# Patient Record
Sex: Male | Born: 1980 | Race: Asian | Hispanic: No | Marital: Single | Smoking: Never smoker
Health system: Southern US, Community
[De-identification: ages and names within clinical notes are randomized; demographics above are authoritative.]

## PROBLEM LIST (undated history)

## (undated) DIAGNOSIS — E7800 Pure hypercholesterolemia, unspecified: Secondary | ICD-10-CM

## (undated) DIAGNOSIS — E119 Type 2 diabetes mellitus without complications: Secondary | ICD-10-CM

## (undated) DIAGNOSIS — I10 Essential (primary) hypertension: Secondary | ICD-10-CM

---

## 2010-07-12 ENCOUNTER — Ambulatory Visit: Payer: Self-pay | Admitting: Family Medicine

## 2010-08-31 ENCOUNTER — Ambulatory Visit: Payer: Self-pay

## 2019-01-14 ENCOUNTER — Other Ambulatory Visit: Payer: Self-pay

## 2019-01-14 ENCOUNTER — Ambulatory Visit
Admission: EM | Admit: 2019-01-14 | Discharge: 2019-01-14 | Disposition: A | Discharge status: Home / Self Care | Payer: BC Managed Care – PPO | Attending: Family Medicine | Admitting: Family Medicine

## 2019-01-14 ENCOUNTER — Encounter: Payer: Self-pay | Admitting: Emergency Medicine

## 2019-01-14 DIAGNOSIS — M109 Gout, unspecified: Secondary | ICD-10-CM

## 2019-01-14 HISTORY — DX: Pure hypercholesterolemia, unspecified: E78.00

## 2019-01-14 HISTORY — DX: Essential (primary) hypertension: I10

## 2019-01-14 HISTORY — DX: Type 2 diabetes mellitus without complications: E11.9

## 2019-01-14 MED ORDER — INDOMETHACIN 50 MG PO CAPS: 50.0000 mg | ORAL_CAPSULE | Freq: Three times a day (TID) | ORAL | 0 refills | Status: AC

## 2019-01-14 NOTE — ED Triage Notes (Signed)
Patient c/o right foot pain that started on Saturday. He denies injury. States his foot is painful to walk on and he has swelling.

## 2019-01-14 NOTE — ED Provider Notes (Signed)
[facility]    CSN: [id number] Arrival date & time: 01/14/19  1832  History   Chief Complaint Chief Complaint  Patient presents with  . Foot Pain   HPI  38 year old male presents with foot pain.  Patient reports that his pain started on Saturday.  He localizes the pain to the MTP joint of the great toe of the right foot.  Pain is moderate to severe.  Worse with ambulation.  No known inciting factor.  He has iced the area without resolution.  No medications or other interventions tried.  No relieving factors.  No other complaints.  Hx reviewed as below. Past Medical History:  Diagnosis Date  . Diabetes mellitus without complication (HCC)   . Hypercholesterolemia   . Hypertension   Morbid obesity Depression Urolithiasis  Home Medications    Prior to Admission medications   Medication Sig Start Date End Date Taking? Authorizing Provider  atorvastatin (LIPITOR) 10 MG tablet Take 10 mg by mouth daily. 09/11/18  Yes [provider]  dapagliflozin propanediol (FARXIGA) 10 MG TABS tablet Take 1/2 tab daily for 1 week then increase to one tab thereafter. 09/11/18  Yes [provider]  glipiZIDE-metformin (METAGLIP) 2.5-500 MG tablet Take by mouth. 09/11/18 09/11/19 Yes [provider]  lisinopril-hydrochlorothiazide (ZESTORETIC) 20-25 MG tablet Take by mouth. 09/11/18 09/11/19 Yes [provider]  indomethacin (INDOCIN) 50 MG capsule Take 1 capsule (50 mg total) by mouth 3 (three) times daily with meals. Discontinue 2 to 3 days after resolution of pain 01/14/19   Tommie Sams, DO   Family Hx: Coronary Artery Disease (Blocked arteries around heart) Father Dad   Diabetes type II Father Dad   High blood pressure (Hypertension) Father Dad   Alcohol abuse Maternal Grandfather Tamera Stands   Hepatitis Mother      Social History Social History   Tobacco Use  . Smoking status: Never Smoker  . Smokeless tobacco: Never Used  Substance  Use Topics  . Alcohol use: Yes  . Drug use: Never     Allergies   Patient has no known allergies.   Review of Systems Review of Systems  Constitutional: Negative.   Musculoskeletal:       Right toe pain.   Physical Exam Triage Vital Signs ED Triage Vitals  Enc Vitals Group     BP 01/14/19 1850 (!) 151/113     Pulse Rate 01/14/19 1850 92     Resp 01/14/19 1850 18     Temp 01/14/19 1850 98.5 F (36.9 C)     Temp Source 01/14/19 1850 Oral     SpO2 01/14/19 1850 98 %     Weight 01/14/19 1848 (!) 320 lb (145.2 kg)     Height 01/14/19 1848 6\' 2"  (1.88 m)     Head Circumference --      Peak Flow --      Pain Score 01/14/19 1847 7     Pain Loc --      Pain Edu? --      Excl. in GC? --    Updated Vital Signs BP (!) 151/113 (BP Location: Right Arm)   Pulse 92   Temp 98.5 F (36.9 C) (Oral)   Resp 18   Ht 6\' 2"  (1.88 m)   Wt (!) 145.2 kg   SpO2 98%   BMI 41.09 kg/m   Visual Acuity Right Eye Distance:   Left Eye Distance:   Bilateral Distance:    Right Eye Near:  Left Eye Near:    Bilateral Near:     Physical Exam Vitals and nursing note reviewed.  Constitutional:      General: He is not in acute distress.    Appearance: Normal appearance. He is obese. He is not ill-appearing.  HENT:     Head: Normocephalic and atraumatic.  Eyes:     General:        Right eye: No discharge.        Left eye: No discharge.     Conjunctiva/sclera: Conjunctivae normal.  Cardiovascular:     Rate and Rhythm: Normal rate and regular rhythm.     Heart sounds: No murmur.  Pulmonary:     Effort: Pulmonary effort is normal.     Breath sounds: Normal breath sounds. No wheezing, rhonchi or rales.  Musculoskeletal:     Comments: Patient with mild swelling and tenderness to palpation of the first MTP of the right foot.  Mild warmth.  Mild erythema.  Neurological:     Mental Status: He is alert.  Psychiatric:        Mood and Affect: Mood normal.        Behavior: Behavior normal.      UC Treatments / Results  Labs (all labs ordered are listed, but only abnormal results are displayed) Labs Reviewed - No data to display  EKG   Radiology No results found.  Procedures Procedures (including critical care time)  Medications Ordered in UC Medications - No data to display  Initial Impression / Assessment and Plan / UC Course  I have reviewed the triage vital signs and the nursing notes.  Pertinent labs & imaging results that were available during my care of the patient were reviewed by me and considered in my medical decision making (see chart for details).    38 year old male presents with gout.  Placing on indomethacin.  Advised to call primary care physician to discuss cessation of HCTZ which may be contributing as diuretics are known to exacerbate/cause gout.  Final Clinical Impressions(s) / UC Diagnoses   Final diagnoses:  Acute gout involving toe of left foot, unspecified cause     Discharge Instructions     Call PCP to discuss change in HCTZ.  Medication as prescribed.  Take care  Dr. Lacinda Axon    ED Prescriptions    Medication Sig Dispense Auth. Provider   indomethacin (INDOCIN) 50 MG capsule Take 1 capsule (50 mg total) by mouth 3 (three) times daily with meals. Discontinue 2 to 3 days after resolution of pain 30 capsule Coral Spikes, DO     PDMP not reviewed this encounter.   Coral Spikes, [place] 01/14/19 2011

## 2019-01-14 NOTE — Discharge Instructions (Addendum)
Call PCP to discuss change in HCTZ.  Medication as prescribed.  Take care  Dr. Lacinda Axon

## 2020-01-19 ENCOUNTER — Other Ambulatory Visit: Payer: Self-pay

## 2020-01-19 ENCOUNTER — Ambulatory Visit
Admission: EM | Admit: 2020-01-19 | Discharge: 2020-01-19 | Disposition: A | Discharge status: Home / Self Care | Payer: BC Managed Care – PPO | Attending: Family Medicine | Admitting: Family Medicine

## 2020-01-19 ENCOUNTER — Ambulatory Visit (INDEPENDENT_AMBULATORY_CARE_PROVIDER_SITE_OTHER): Payer: BC Managed Care – PPO

## 2020-01-19 DIAGNOSIS — M1611 Unilateral primary osteoarthritis, right hip: Secondary | ICD-10-CM

## 2020-01-19 DIAGNOSIS — M25561 Pain in right knee: Secondary | ICD-10-CM | POA: Diagnosis not present

## 2020-01-19 DIAGNOSIS — M25551 Pain in right hip: Secondary | ICD-10-CM | POA: Diagnosis not present

## 2020-01-19 DIAGNOSIS — M79661 Pain in right lower leg: Secondary | ICD-10-CM

## 2020-01-19 MED ORDER — MELOXICAM 15 MG PO TABS
15.0000 mg | ORAL_TABLET | Freq: Every day | ORAL | 0 refills | Status: AC | PRN
Start: 2020-01-19 — End: ?

## 2020-01-19 NOTE — Discharge Instructions (Signed)
Medication as prescribed.  If persists, call [facility] Orthopedics [phone]) OR [facility] [phone]) for an appt.  Take care  Dr. Adriana Simas

## 2020-01-19 NOTE — ED Triage Notes (Signed)
Pt reports having R leg pain since Sunday. No known injury to leg. Taken otc pain meds with some relief.

## 2020-01-19 NOTE — ED Provider Notes (Signed)
[facility]    CSN: [id number] Arrival date & time: 01/19/20  1340      History   Chief Complaint Chief Complaint  Patient presents with  . Leg Pain   HPI   40 year old male presents with right leg pain.  Started on Sunday.  No fall, trauma, injury.  No known inciting factor.  He reports low back pain as well as pain in the proximal thigh and knee.  Pain 9/10 in severity.  He has taken ibuprofen with some improvement but no resolution.  No change in activity.  No reported heavy lifting.  No other complaints or concerns at this time.  Past Medical History:  Diagnosis Date  . Diabetes mellitus without complication (HCC)   . Hypercholesterolemia   . Hypertension    Home Medications    Prior to Admission medications   Medication Sig Start Date End Date Taking? Authorizing Provider  meloxicam (MOBIC) 15 MG tablet Take 1 tablet (15 mg total) by mouth daily as needed for pain. 01/19/20  Yes Ferlin Fairhurst G, DO  atorvastatin (LIPITOR) 10 MG tablet Take 10 mg by mouth daily. 09/11/18   [provider]  dapagliflozin propanediol (FARXIGA) 10 MG TABS tablet Take 1/2 tab daily for 1 week then increase to one tab thereafter. 09/11/18   [provider]  glipiZIDE-metformin (METAGLIP) 2.5-500 MG tablet Take by mouth. 09/11/18 09/11/19  [provider]  indomethacin (INDOCIN) 50 MG capsule Take 1 capsule (50 mg total) by mouth 3 (three) times daily with meals. Discontinue 2 to 3 days after resolution of pain 01/14/19   Tommie Sams, DO  lisinopril-hydrochlorothiazide (ZESTORETIC) 20-25 MG tablet Take by mouth. 09/11/18 09/11/19  [provider]   Social History Social History   Tobacco Use  . Smoking status: Never Smoker  . Smokeless tobacco: Never Used  Vaping Use  . Vaping Use: Never used  Substance Use Topics  . Alcohol use: Yes  . Drug use: Never     Allergies   Patient has no known allergies.   Review of Systems Review of Systems   Constitutional: Negative.   Musculoskeletal:       Right knee pain, Right hip pain.   Physical Exam Triage Vital Signs ED Triage Vitals  Enc Vitals Group     BP 01/19/20 1520 (!) 178/99     Pulse Rate 01/19/20 1520 83     Resp 01/19/20 1520 16     Temp 01/19/20 1520 98.3 F (36.8 C)     Temp src --      SpO2 01/19/20 1520 96 %     Weight 01/19/20 1521 (!) 305 lb (138.3 kg)     Height 01/19/20 1521 6\' 2"  (1.88 m)     Head Circumference --      Peak Flow --      Pain Score 01/19/20 1520 9     Pain Loc --      Pain Edu? --      Excl. in GC? --    Updated Vital Signs BP (!) 178/99   Pulse 83   Temp 98.3 F (36.8 C)   Resp 16   Ht 6\' 2"  (1.88 m)   Wt (!) 138.3 kg   SpO2 96%   BMI 39.16 kg/m   Visual Acuity Right Eye Distance:   Left Eye Distance:   Bilateral Distance:    Right Eye Near:   Left Eye Near:    Bilateral Near:  Physical Exam Constitutional:      General: He is not in acute distress.    Appearance: Normal appearance. He is obese. He is not ill-appearing.  HENT:     Head: Normocephalic and atraumatic.  Eyes:     General:        Right eye: No discharge.        Left eye: No discharge.     Conjunctiva/sclera: Conjunctivae normal.  Pulmonary:     Effort: Pulmonary effort is normal. No respiratory distress.  Musculoskeletal:     Comments: Right hip -no tenderness over the lateral trochanter.  Patient endorsing tenderness in the groin.  Right knee with mild tenderness of the anterior joint line.  No effusion.  Ligaments intact.  Neurological:     Mental Status: He is alert.  Psychiatric:        Mood and Affect: Mood normal.        Behavior: Behavior normal.    UC Treatments / Results  Labs (all labs ordered are listed, but only abnormal results are displayed) Labs Reviewed - No data to display  EKG   Radiology DG Knee Complete 4 Views Right  Result Date: 01/19/2020 CLINICAL DATA:  Right lower leg pain since this past Sunday. No known  injury. EXAM: RIGHT KNEE - COMPLETE 4+ VIEW COMPARISON:  Right hip radiographs-earlier same day FINDINGS: No fracture or dislocation. Joint spaces are preserved. No joint effusion. No evidence of chondrocalcinosis. Regional soft tissues appear normal. IMPRESSION: Normal radiographs of the right knee. Electronically Signed   By: Sandi Mariscal M.D.   On: 01/19/2020 16:29   DG Hip Unilat W or Wo Pelvis 2-3 Views Right  Result Date: 01/19/2020 CLINICAL DATA:  Right hip pain since this past Saturday. No known injury. EXAM: DG HIP (WITH OR WITHOUT PELVIS) 2-3V RIGHT COMPARISON:  None. FINDINGS: No fracture or dislocation. Mild degenerative change the right hip with joint space loss, subchondral sclerosis and osteophytosis. No evidence of avascular necrosis Limited visualization of the pelvis is normal. Limited visualization of the contralateral left hip is normal. Enthesopathic change involving the left greater trochanter. Regional soft tissues appear normal.  No radiopaque foreign body. IMPRESSION: 1. No acute findings. 2. Mild degenerative change of the right hip. Electronically Signed   By: Sandi Mariscal M.D.   On: 01/19/2020 16:31    Procedures Procedures (including critical care time)  Medications Ordered in UC Medications - No data to display  Initial Impression / Assessment and Plan / UC Course  I have reviewed the triage vital signs and the nursing notes.  Pertinent labs & imaging results that were available during my care of the patient were reviewed by me and considered in my medical decision making (see chart for details).    40 year old male presents with hip pain and knee pain.  X-rays reviewed and independently interpreted by me.  Hip film revealed mild degenerative changes.  Knee film normal.  Advise rest.  Meloxicam as directed.   Final Clinical Impressions(s) / UC Diagnoses   Final diagnoses:  Primary osteoarthritis of right hip  Acute pain of right knee     Discharge Instructions      Medication as prescribed.  If persists, call [facility] [phone]) OR [facility] [phone]) for an appt.  Take care  Dr. Lacinda Axon     ED Prescriptions    Medication Sig Dispense Auth. Provider   meloxicam (MOBIC) 15 MG tablet Take 1 tablet (15 mg total) by mouth daily  as needed for pain. 30 tablet Tommie Sams, DO     PDMP not reviewed this encounter.   Tommie Sams, [place] 01/19/20 1732

## 2021-02-03 IMAGING — CR DG HIP (WITH OR WITHOUT PELVIS) 2-3V*R*
3 series · 3 of 3 positions shown · non-contrast
Comparison: None.

CLINICAL DATA: Right hip pain since this past [REDACTED]. No known
injury.

EXAM:
DG HIP (WITH OR WITHOUT PELVIS) 2-3V RIGHT

[pelvis ap]
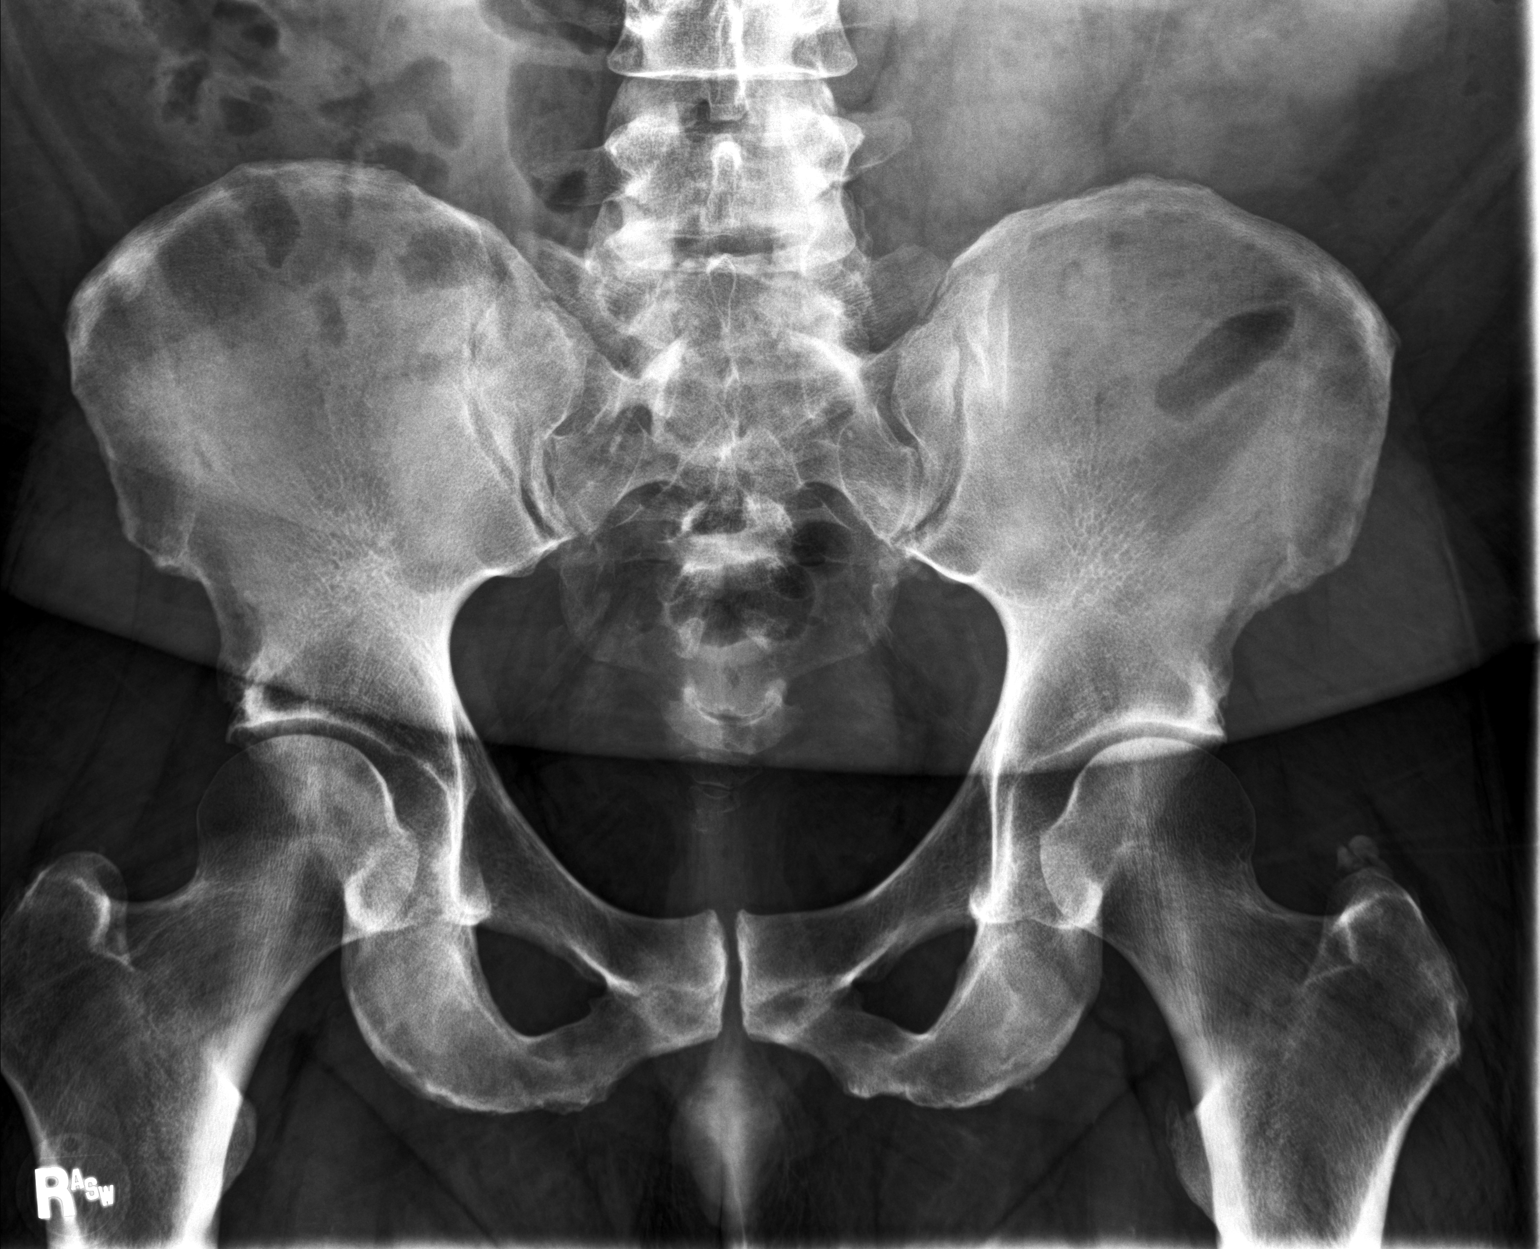

[hip ap]
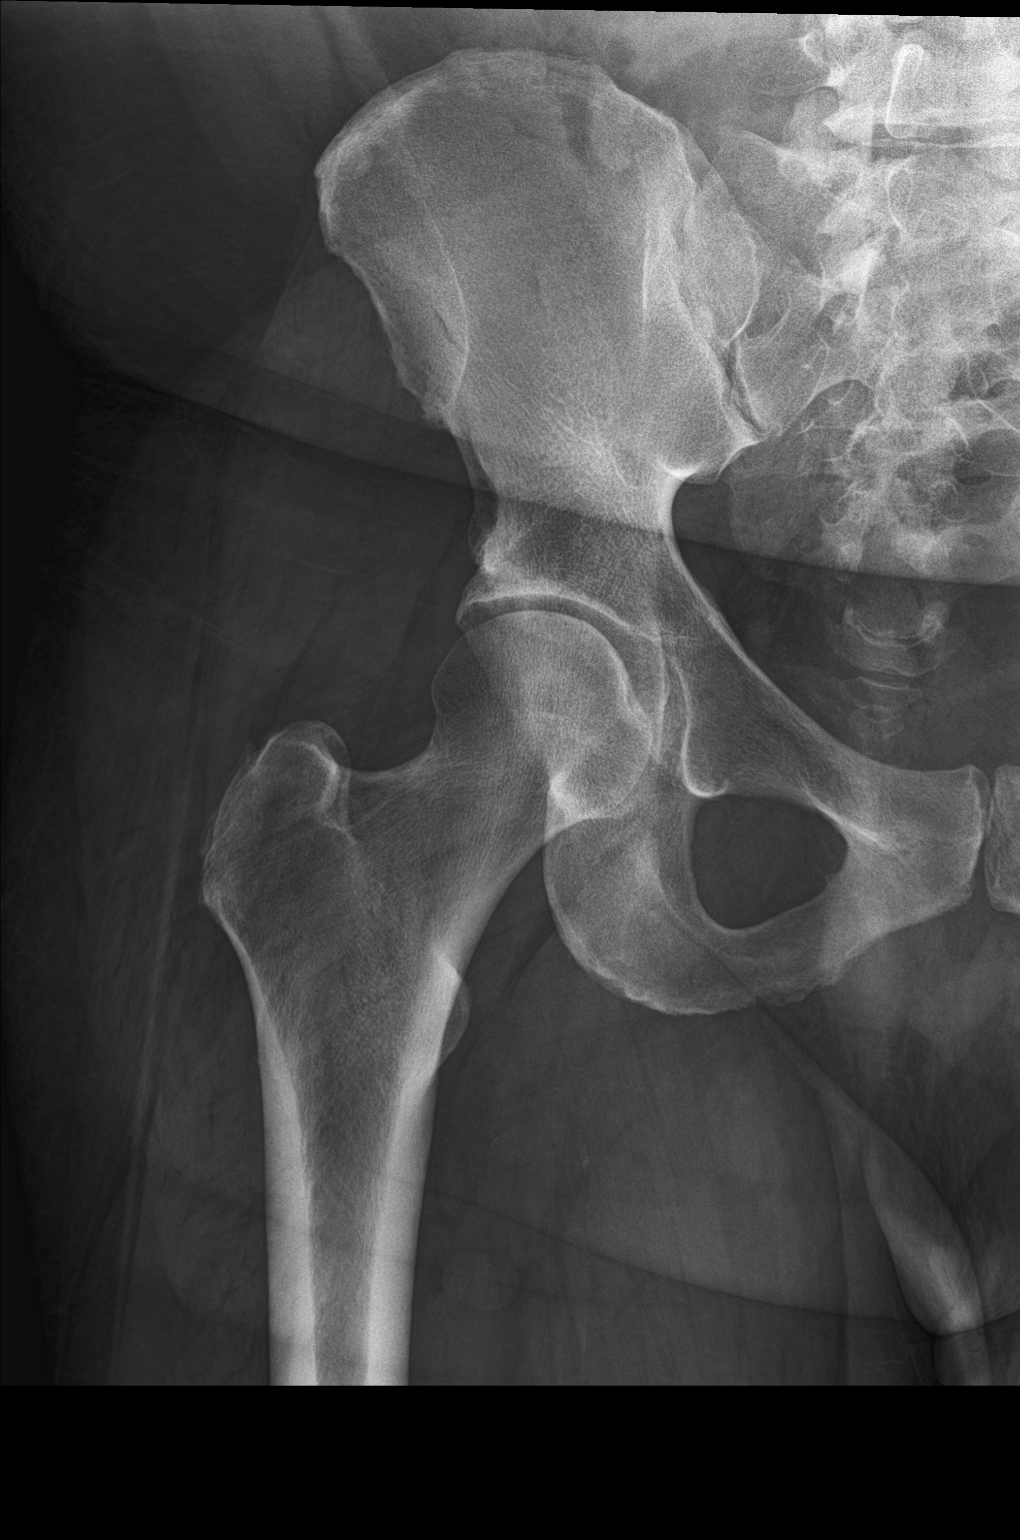

[hip lat]
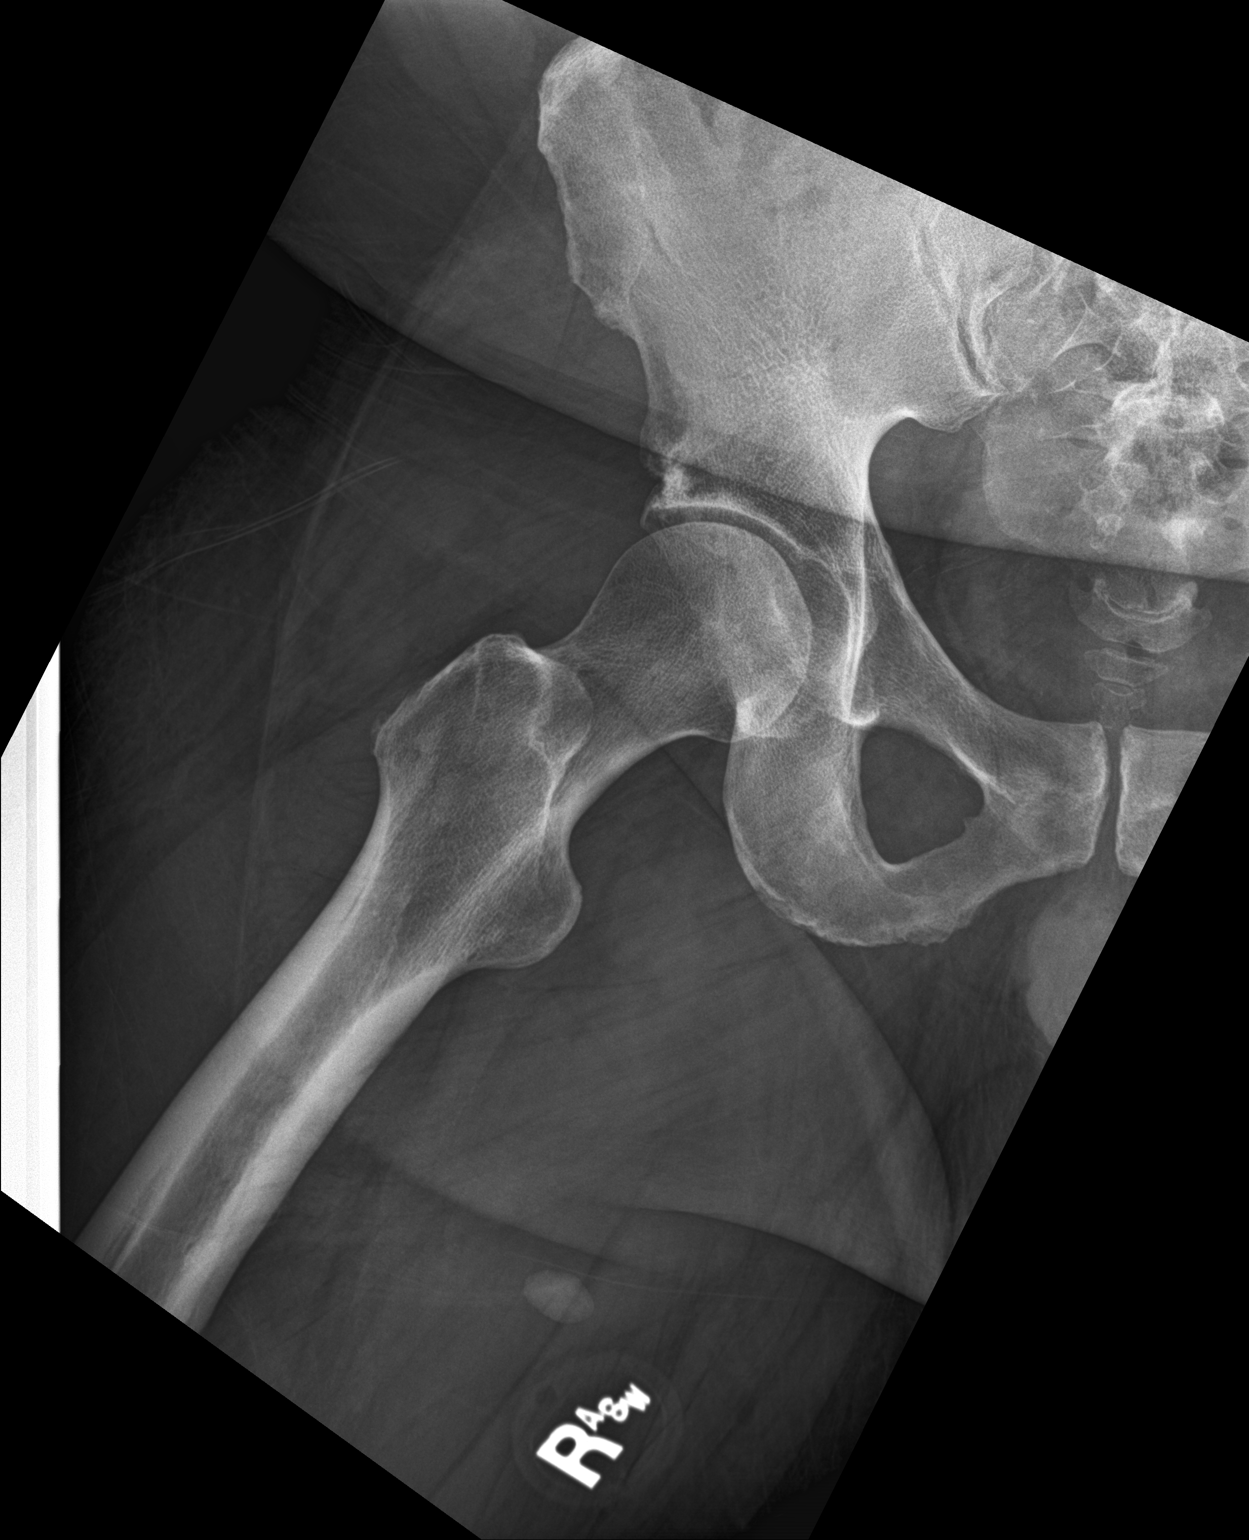

[3 of 3 positions shown; findings below may reference images not displayed]

FINDINGS: No fracture or dislocation. Mild degenerative change the right hip
with joint space loss, subchondral sclerosis and osteophytosis. No
evidence of avascular necrosis

Limited visualization of the pelvis is normal. Limited visualization
of the contralateral left hip is normal. Enthesopathic change
involving the left greater trochanter.

Regional soft tissues appear normal.  No radiopaque foreign body.
IMPRESSION: 1. No acute findings.
2. Mild degenerative change of the right hip.

## 2022-09-16 ENCOUNTER — Ambulatory Visit: Payer: BC Managed Care – PPO

## 2022-09-16 ENCOUNTER — Encounter: Payer: Self-pay | Admitting: Emergency Medicine

## 2022-09-16 ENCOUNTER — Ambulatory Visit
Admission: EM | Admit: 2022-09-16 | Discharge: 2022-09-16 | Disposition: A | Discharge status: Home / Self Care | Payer: BC Managed Care – PPO | Attending: Emergency Medicine | Admitting: Emergency Medicine

## 2022-09-16 DIAGNOSIS — S83412A Sprain of medial collateral ligament of left knee, initial encounter: Secondary | ICD-10-CM | POA: Diagnosis not present

## 2022-09-16 DIAGNOSIS — I10 Essential (primary) hypertension: Secondary | ICD-10-CM

## 2022-09-16 DIAGNOSIS — M25562 Pain in left knee: Secondary | ICD-10-CM | POA: Diagnosis not present

## 2022-09-16 NOTE — ED Triage Notes (Signed)
Patient c/o states that he slipped on a wet floor yesterday and injured his left knee.  Patient has not taken any OTC for his pain.

## 2022-09-16 NOTE — Discharge Instructions (Signed)
Avoid heavy activity with left leg until after follow up with orthopedics. Ibuprofen for pain. Use knee brace for support. Ice the area 2-3 times daily for comfort  Make sure to take your blood pressure meds when you get home. If you have symptoms like severe headache, vision changes, stroke like symptoms, then need to go to the hospital.

## 2022-09-16 NOTE — ED Provider Notes (Signed)
[facility]    CSN: [id number] Arrival date & time: 09/16/22  0957      History   Chief Complaint Chief Complaint  Patient presents with   Knee Pain    left    HPI Neil Cervantes is a 42 y.o. male.   42 year old male who presents to urgent care with complaints of the left knee pain.  He reports yesterday he slipped in the kitchen on some water and went straight down on his left knee with it slightly bent back.  He immediately started to have pain along the medial aspect but was able to weight-bear without difficulty.  He has had no history of injury to the knee in the past.  He is still able to walk but is having pain along the medial aspect.  He denies any locking or giving out.  He had some swelling but no bruising.   Blood pressure is elevated as well, he didn't take BP meds this am, no symptoms such as headaches, vision changes, strokelike symptoms.   Knee Pain Associated symptoms: no back pain and no fever     Past Medical History:  Diagnosis Date   Diabetes mellitus without complication (HCC)    Hypercholesterolemia    Hypertension     There are no problems to display for this patient.   History reviewed. No pertinent surgical history.     Home Medications    Prior to Admission medications   Medication Sig Start Date End Date Taking? Authorizing Provider  atorvastatin (LIPITOR) 10 MG tablet Take 10 mg by mouth daily. 09/11/18  Yes [provider]  dapagliflozin propanediol (FARXIGA) 10 MG TABS tablet Take 1/2 tab daily for 1 week then increase to one tab thereafter. 09/11/18  Yes [provider]  glipiZIDE-metformin (METAGLIP) 2.5-500 MG tablet Take by mouth. 09/11/18 09/16/22 Yes [provider]  lisinopril-hydrochlorothiazide (ZESTORETIC) 20-25 MG tablet Take by mouth. 09/11/18 09/16/22 Yes [provider]  indomethacin (INDOCIN) 50 MG capsule Take 1 capsule (50 mg total) by mouth 3 (three) times daily with meals.  Discontinue 2 to 3 days after resolution of pain 01/14/19   Everlene Other G, DO  meloxicam (MOBIC) 15 MG tablet Take 1 tablet (15 mg total) by mouth daily as needed for pain. 01/19/20   Tommie Sams, DO    Family History History reviewed. No pertinent family history.  Social History Social History   Tobacco Use   Smoking status: Never   Smokeless tobacco: Never  Vaping Use   Vaping status: Never Used  Substance Use Topics   Alcohol use: Yes   Drug use: Never     Allergies   Patient has no known allergies.   Review of Systems Review of Systems  Constitutional:  Negative for chills and fever.  HENT:  Negative for ear pain and sore throat.   Eyes:  Negative for pain and visual disturbance.  Respiratory:  Negative for cough and shortness of breath.   Cardiovascular:  Negative for chest pain and palpitations.  Gastrointestinal:  Negative for abdominal pain and vomiting.  Genitourinary:  Negative for dysuria and hematuria.  Musculoskeletal:  Positive for joint swelling. Negative for arthralgias and back pain.       Left medial knee pain  Skin:  Negative for color change and rash.  Neurological:  Negative for seizures and syncope.  All other systems reviewed and are negative.    Physical Exam Triage Vital Signs ED Triage Vitals  Encounter Vitals Group  BP 09/16/22 1023 (!) 191/109     Systolic BP Percentile --      Diastolic BP Percentile --      Pulse Rate 09/16/22 1023 88     Resp 09/16/22 1023 15     Temp 09/16/22 1023 98.5 F (36.9 C)     Temp Source 09/16/22 1023 Oral     SpO2 09/16/22 1023 99 %     Weight 09/16/22 1021 (!) 304 lb 14.3 oz (138.3 kg)     Height 09/16/22 1021 6\' 2"  (1.88 m)     Head Circumference --      Peak Flow --      Pain Score 09/16/22 1021 10     Pain Loc --      Pain Education --      Exclude from Growth Chart --    No data found.  Updated Vital Signs BP (!) 191/109 (BP Location: Right Arm) Comment: Patient has not taken BP  medicine today  Pulse 88   Temp 98.5 F (36.9 C) (Oral)   Resp 15   Ht 6\' 2"  (1.88 m)   Wt (!) 304 lb 14.3 oz (138.3 kg)   SpO2 99%   BMI 39.15 kg/m   Visual Acuity Right Eye Distance:   Left Eye Distance:   Bilateral Distance:    Right Eye Near:   Left Eye Near:    Bilateral Near:     Physical Exam Vitals and nursing note reviewed.  Constitutional:      General: He is not in acute distress.    Appearance: He is well-developed.  HENT:     Head: Normocephalic and atraumatic.  Eyes:     Conjunctiva/sclera: Conjunctivae normal.  Cardiovascular:     Rate and Rhythm: Normal rate and regular rhythm.     Heart sounds: No murmur heard. Pulmonary:     Effort: Pulmonary effort is normal. No respiratory distress.     Breath sounds: Normal breath sounds.  Abdominal:     Palpations: Abdomen is soft.     Tenderness: There is no abdominal tenderness.  Musculoskeletal:        General: No swelling.     Cervical back: Neck supple.     Right knee: No swelling or effusion. No MCL laxity.     Instability Tests: Anterior drawer test negative.     Left knee: Swelling and effusion present. MCL laxity (Very mild) present.     Instability Tests: Anterior drawer test negative. Medial McMurray test negative.  Skin:    General: Skin is warm and dry.     Capillary Refill: Capillary refill takes less than 2 seconds.  Neurological:     Mental Status: He is alert.  Psychiatric:        Mood and Affect: Mood normal.      UC Treatments / Results  Labs (all labs ordered are listed, but only abnormal results are displayed) Labs Reviewed - No data to display  EKG   Radiology No results found.  Procedures Procedures (including critical care time)  Medications Ordered in UC Medications - No data to display  Initial Impression / Assessment and Plan / UC Course  I have reviewed the triage vital signs and the nursing notes.  Pertinent labs & imaging results that were available during  my care of the patient were reviewed by me and considered in my medical decision making (see chart for details).     Left medial collateral ligament strain: The patient has  mild laxity of his left MCL but otherwise the knee exam is normal.  We will place him in a brace and have him do limited activity until following up with the orthopedist for further evaluation.  He may use ibuprofen every 6 hours as needed for pain.  In regard to his blood pressure here in the office, he has no clinically concerning symptoms therefore we will have him take his blood pressure medicine when he gets home and advised him to return to the emergency room should he develop symptoms such as severe headache, vision changes, strokelike symptoms. Final Clinical Impressions(s) / UC Diagnoses   Final diagnoses:  None   Discharge Instructions   None    ED Prescriptions   None    PDMP not reviewed this encounter.   Landis Martins, [place] 09/16/22 1114
